# Patient Record
Sex: Male | Born: 1965 | Race: Black or African American | Hispanic: No | Marital: Single
Health system: Southern US, Community
[De-identification: ages and names within clinical notes are randomized; demographics above are authoritative.]

---

## 1999-07-09 ENCOUNTER — Ambulatory Visit (HOSPITAL_COMMUNITY): Admission: RE | Admit: 1999-07-09 | Discharge: 1999-07-09 | Payer: Self-pay | Admitting: Cardiology

## 1999-07-09 ENCOUNTER — Encounter: Payer: Self-pay | Admitting: Cardiology

## 2002-11-17 ENCOUNTER — Encounter: Admission: RE | Admit: 2002-11-17 | Discharge: 2002-12-29 | Payer: Self-pay | Admitting: Family Medicine

## 2010-06-30 ENCOUNTER — Encounter: Payer: Self-pay | Admitting: Cardiology

## 2015-05-23 ENCOUNTER — Other Ambulatory Visit: Payer: Self-pay | Admitting: Cardiology

## 2015-05-23 DIAGNOSIS — M5416 Radiculopathy, lumbar region: Secondary | ICD-10-CM

## 2015-06-01 ENCOUNTER — Other Ambulatory Visit: Payer: Self-pay | Admitting: Cardiology

## 2015-06-01 ENCOUNTER — Ambulatory Visit
Admission: RE | Admit: 2015-06-01 | Discharge: 2015-06-01 | Disposition: A | Discharge status: Home / Self Care | Payer: Managed Care, Other (non HMO) | Source: Ambulatory Visit | Attending: Cardiology | Admitting: Cardiology

## 2015-06-01 DIAGNOSIS — M5441 Lumbago with sciatica, right side: Principal | ICD-10-CM

## 2015-06-01 DIAGNOSIS — M5442 Lumbago with sciatica, left side: Principal | ICD-10-CM

## 2015-06-01 DIAGNOSIS — G8929 Other chronic pain: Secondary | ICD-10-CM

## 2015-06-01 DIAGNOSIS — M5416 Radiculopathy, lumbar region: Secondary | ICD-10-CM

## 2015-06-14 ENCOUNTER — Ambulatory Visit: Payer: Managed Care, Other (non HMO) | Attending: Cardiology

## 2015-06-14 DIAGNOSIS — M5441 Lumbago with sciatica, right side: Secondary | ICD-10-CM | POA: Diagnosis present

## 2015-06-14 DIAGNOSIS — R293 Abnormal posture: Secondary | ICD-10-CM | POA: Diagnosis present

## 2015-06-14 DIAGNOSIS — R6889 Other general symptoms and signs: Secondary | ICD-10-CM | POA: Insufficient documentation

## 2015-06-14 DIAGNOSIS — M2560 Stiffness of unspecified joint, not elsewhere classified: Secondary | ICD-10-CM | POA: Insufficient documentation

## 2015-06-14 NOTE — Patient Instructions (Signed)
From cabinet posterior pelvic tilt , LTR, knee to chest all for 2x/day, 2-3 reps  30 sec stretch and 5-10 sec hold  X 10 15 reps posterior tilt

## 2015-06-14 NOTE — Therapy (Signed)
[facility] [address] [place], [place], [address] Phone: [phone]   Fax:  [phone]  Physical Therapy Evaluation  Patient Details  Name: Travis Gillespie MRN: [id number] Date of Birth: [birth year] Referring Provider: Donia Guiles , MD  Encounter Date: 06/14/2015      PT End of Session - 06/14/15 1525    Visit Number 1   Number of Visits 12   Date for PT Re-Evaluation 07/27/15   Authorization Type [employer]   PT Start Time 1100   PT Stop Time 1145   PT Time Calculation (min) 45 min   Activity Tolerance Patient tolerated treatment well   Behavior During Therapy [facility] for tasks assessed/performed      No past medical history on file.  No past surgical history on file.  There were no vitals filed for this visit.  Visit Diagnosis:  Right-sided low back pain with right-sided sciatica - Plan: PT plan of care cert/re-cert  Joint stiffness of spine - Plan: PT plan of care cert/re-cert  Abnormal posture - Plan: PT plan of care cert/re-cert  Activity intolerance - Plan: PT plan of care cert/re-cert      Subjective Assessment - 06/14/15 1117    Subjective LBP with leg pain. Onset in past year worse past 6 months.     Pertinent History No specific injury   How long can you sit comfortably? 45 min   How long can you stand comfortably? 15 min   How long can you walk comfortably?  support does well,  100 yards without   Diagnostic tests X rays: significant degenerative changes and stenosis   Patient Stated Goals Ease/eliminate pain.    Currently in Pain? Yes   Pain Score 3    Pain Location Back  to buttocks   Pain Orientation Right   Pain Descriptors / Indicators Aching   Pain Type Chronic pain   Pain Radiating Towards To RT ankle lateral aspect but can change to RT or LT   Pain Onset More than a month ago   Pain Frequency Constant   Aggravating Factors  standing and walking    Pain Relieving Factors bent forward ,  sitting, mediation   Multiple Pain Sites No            [facility] PT Assessment - 06/14/15 0001    Assessment   Medical Diagnosis Back pain with radiculopathy   Referring Provider Donia Guiles , MD   Onset Date/Surgical Date --  Worse in past 6 months  pain over last year   Hand Dominance Right   Next MD Visit 07/22/2015   Prior Therapy No   Precautions   Precautions None   Restrictions   Weight Bearing Restrictions No   Balance Screen   Has the patient fallen in the past 6 months Yes   How many times? 1  legs went out due to pain   Has the patient had a decrease in activity level because of a fear of falling?  No   Is the patient reluctant to leave their home because of a fear of falling?  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    Cognition   Overall Cognitive Status Within Functional Limits for tasks assessed   Posture/Postural Control   Posture Comments Increased lordosis mild posterior lean hips  ER    ROM / Strength   AROM / PROM / Strength Strength;AROM   AROM   Overall AROM Comments Hip IR 15 degrees bilaterally in prone with gluteal pain   AROM Assessment Site Lumbar   Lumbar Flexion He is able to reach to 2 inches from toes   Lumbar Extension 18   Lumbar - Right Side Bend 30   Lumbar - Left Side Bend 22   Lumbar - Right Rotation decr 50%    Lumbar - Left Rotation decr 70%   Strength   Overall Strength Comments Fair abdominals, LE grossly WNL RT to LT    Palpation   Palpation comment Tender RT lower back and gluteals    Ambulation/Gait   Gait Comments WNL                           PT Education - 06/14/15 1134    Education provided Yes   Education Details POC , HEP    Person(s) Educated Patient   Methods Explanation;Demonstration;Verbal cues;Tactile cues;Handout   Comprehension Returned  demonstration          PT Short Term Goals - 06/14/15 1526    PT SHORT TERM GOAL #1   Title He will be independent with initial HEP   Time 3   Period Weeks   Status New   PT SHORT TERM GOAL #2   Title He will report pai n improved 40   PT SHORT TERM GOAL #3   Title He will report pain improved 40 % or more  with normal home activity   Time 3   Period Weeks   Status New   PT SHORT TERM GOAL #4   Title He will demo understanding of proper sitting posture   Time 3   Period Weeks   Status New   PT SHORT TERM GOAL #5   Title He will report leg pain decreased 40% or more with home tasks.    Time 3   Period Weeks   Status New           PT Long Term Goals - 06/14/15 1529    PT LONG TERM GOAL #1   Title He will be independent with all  HEP issued as of the last visit   Time 6   Period Weeks   Status New   PT LONG TERM GOAL #2   Title He will report no pain RT or LT leg for 5 days   Time 6   Period Weeks   Status New   PT LONG TERM GOAL #3   Title He will report back pain as intermittant   Time 6   Period Weeks   Status New   PT LONG TERM GOAL #4   Title He will be able to lift 25 pounds without increased pain for possible return to work   Time 6   Period Weeks   Status New               Plan - 06/14/15 1533    Clinical Impression Statement Mr Courville presents with back pain with pain into RT leg limiting his   Pt will benefit from skilled therapeutic intervention in order to improve on the following deficits Pain;Decreased range of motion;Decreased activity tolerance;Increased muscle spasms;Postural dysfunction   Rehab Potential Good   PT Frequency 2x / week   PT Duration 6 weeks   PT Treatment/Interventions Cryotherapy;Electrical Stimulation;Iontophoresis 4mg /ml Dexamethasone;Moist  Heat;Ultrasound;Therapeutic exercise;Patient/family education;Manual techniques;Passive range of motion;Dry needling;Taping   PT Next Visit Plan Progress HEP and modalities  as needed.Manual    PT Home Exercise Plan stretching   Consulted and Agree with Plan of Care Patient         Problem List There are no active problems to display for this patient.   Caprice RedChasse, Taiki Buckwalter M PT 06/14/2015, 3:45 PM  [facility] [address] [place], [place], [address] Phone: [phone]   Fax:  [phone]  Name: Travis Gillespie MRN: [id number] Date of Birth: October 01, 1965

## 2015-06-19 ENCOUNTER — Ambulatory Visit: Payer: Managed Care, Other (non HMO) | Admitting: Physical Therapy

## 2015-06-22 ENCOUNTER — Ambulatory Visit: Payer: Managed Care, Other (non HMO)

## 2015-06-28 ENCOUNTER — Ambulatory Visit: Payer: Managed Care, Other (non HMO) | Admitting: Physical Therapy

## 2015-07-03 ENCOUNTER — Encounter: Payer: Managed Care, Other (non HMO) | Admitting: Physical Therapy

## 2015-07-05 ENCOUNTER — Ambulatory Visit: Payer: Managed Care, Other (non HMO)

## 2015-07-05 DIAGNOSIS — R6889 Other general symptoms and signs: Secondary | ICD-10-CM

## 2015-07-05 DIAGNOSIS — M2560 Stiffness of unspecified joint, not elsewhere classified: Secondary | ICD-10-CM

## 2015-07-05 DIAGNOSIS — M5441 Lumbago with sciatica, right side: Secondary | ICD-10-CM

## 2015-07-05 DIAGNOSIS — R293 Abnormal posture: Secondary | ICD-10-CM

## 2015-07-05 NOTE — Therapy (Signed)
[facility] [address] [place], [place], [address] Phone: [phone]   Fax:  [phone]  Physical Therapy Treatment  Patient Details  Name: Travis Gillespie MRN: [id number] Date of Birth: 1966/03/19 Referring Provider: Donia Guiles , MD  Encounter Date: 07/05/2015      PT End of Session - 07/05/15 1117    Visit Number 2   Number of Visits 12   Date for PT Re-Evaluation 08/24/15   PT Start Time 1100   PT Stop Time 1150   PT Time Calculation (min) 50 min   Activity Tolerance Patient tolerated treatment well   Behavior During Therapy [facility] for tasks assessed/performed      No past medical history on file.  No past surgical history on file.  There were no vitals filed for this visit.  Visit Diagnosis:  Right-sided low back pain with right-sided sciatica - Plan: PT plan of care cert/re-cert  Joint stiffness of spine - Plan: PT plan of care cert/re-cert  Abnormal posture - Plan: PT plan of care cert/re-cert  Activity intolerance - Plan: PT plan of care cert/re-cert      Subjective Assessment - 07/05/15 1107    Subjective Same situation with soreness in back. He reports attendance issue was due to finance. Did exercoises 2x/day.    How long can you sit comfortably? 1-2 hours   How long can you stand comfortably? 20 min   Currently in Pain? No/denies   Pain Score --  Cam be as high as 5/10   Multiple Pain Sites No                         [facility] Adult PT Treatment/Exercise - 07/05/15 1123    Lumbar Exercises: Supine   Bent Knee Raise 10 reps   Bent Knee Raise Limitations RT and LT  3 sec    Other Supine Lumbar Exercises head and shouilder raise with reach toward ankles in 100's prep.   Lumbar Exercises: Sidelying   Clam 10 reps     Much of time was spent in initial re assesment since not here for 3 weeks and with demo and cues to do exercises correctly             PT Short Term Goals -  07/05/15 1152    PT SHORT TERM GOAL #1   Title He will be independent with initial HEP   Status Achieved   PT SHORT TERM GOAL #2   Title He will report pai n improved 40   Status On-going   PT SHORT TERM GOAL #3   Title He will report pain improved 40 % or more  with normal home activity   Status On-going   PT SHORT TERM GOAL #4   Title He will demo understanding of proper sitting posture   Status On-going   PT SHORT TERM GOAL #5   Title He will report leg pain decreased 40% or more with home tasks.    Status On-going           PT Long Term Goals - 06/14/15 1529    PT LONG TERM GOAL #1   Title He will be independent with all  HEP issued as of the last visit   Time 6   Period Weeks   Status New   PT LONG TERM GOAL #2   Title He will report no pain RT or LT leg for 5 days   Time  6   Period Weeks   Status New   PT LONG TERM GOAL #3   Title He will report back pain as intermittant   Time 6   Period Weeks   Status New   PT LONG TERM GOAL #4   Title He will be able to lift 25 pounds without increased pain for possible return to work   Time 6   Period Weeks   Status New               Plan - 07/05/15 1117    Clinical Impression Statement [name] reports no improvement but the time he tolerates sitting and standing were better than at eval. He is able to demo HEP. Continue stab exercises   PT Next Visit Plan Stab exercises and modalities as needed   Consulted and Agree with Plan of Care Patient        Problem List There are no active problems to display for this patient.   Caprice Red PT 07/05/2015, 11:57 AM  [facility] [address] [place], [place], [address] Phone: [phone]   Fax:  [phone]  Name: Travis Gillespie MRN: [id number] Date of Birth: 17-Nov-1965

## 2015-07-05 NOTE — Patient Instructions (Signed)
Issued from cabinet Pilates scissors, shoulder bridge and clam 10-15 reps, 1-2x/day hold 3-5 sec hold

## 2015-07-10 ENCOUNTER — Ambulatory Visit: Payer: Managed Care, Other (non HMO) | Admitting: Physical Therapy

## 2015-07-12 ENCOUNTER — Ambulatory Visit: Payer: Managed Care, Other (non HMO) | Attending: Cardiology | Admitting: Physical Therapy

## 2015-07-12 DIAGNOSIS — R293 Abnormal posture: Secondary | ICD-10-CM | POA: Insufficient documentation

## 2015-07-12 DIAGNOSIS — R6889 Other general symptoms and signs: Secondary | ICD-10-CM | POA: Diagnosis present

## 2015-07-12 DIAGNOSIS — M5441 Lumbago with sciatica, right side: Secondary | ICD-10-CM | POA: Diagnosis not present

## 2015-07-12 DIAGNOSIS — M2560 Stiffness of unspecified joint, not elsewhere classified: Secondary | ICD-10-CM

## 2015-07-12 NOTE — Therapy (Addendum)
[place], [place], [address] Phone: [phone]   Fax:  [phone]  Physical Therapy Treatment  Patient Details  Name: Travis Gillespie MRN: [id number] Date of Birth: 1966-01-11 Referring Provider: Wallene Huh , MD  Encounter Date: 07/12/2015      PT End of Session - 07/12/15 1321    Visit Number 3   Number of Visits 12   Date for PT Re-Evaluation 08/24/15   PT Start Time 1103   PT Stop Time 1146   PT Time Calculation (min) 43 min   Activity Tolerance Patient tolerated treatment well   Behavior During Therapy [facility] for tasks assessed/performed      No past medical history on file.  No past surgical history on file.  There were no vitals filed for this visit.  Visit Diagnosis:  Right-sided low back pain with right-sided sciatica  Joint stiffness of spine  Abnormal posture  Activity intolerance      Subjective Assessment - 07/12/15 1109    Subjective 1-2/10.  No leg pain.  Has pain with walking unsupported    Currently in Pain? Yes   Pain Score 1   to 2/10   Pain Location Back   Pain Orientation Right   Pain Descriptors / Indicators Aching   Pain Radiating Towards Leg  RT/LT intermittantly   Aggravating Factors  walking without support   Pain Relieving Factors Bending forward. Stretching                         [facility] Adult PT Treatment/Exercise - 07/12/15 0001    Self-Care   Self-Care --  ADL handout issued, briefly discussed, demonstrated some   Lumbar Exercises: Stretches   Passive Hamstring Stretch 3 reps;30 seconds   Lower Trunk Rotation 5 reps;10 seconds  both,  pain end range legs each side   Lumbar Exercises: Standing   Heel Raises 10 reps  both,  RT cues to brace abdomin to avoid pain.   Side Lunge --  4 X side stepping 2 steps each RT and LT.  with mini squat.    Wall Slides 10 reps;1 second  added to home exercises.   Other Standing Lumbar Exercises  hip hinge 10 X cues   Lumbar Exercises: Supine   Ab Set 5 reps  breathing   Bent Knee Raise 10 reps   Bent Knee Raise Limitations cues breathing   Bridge 10 reps  cues   Other Supine Lumbar Exercises modified curl ups 15 reps, breathing, technique cues,    Lumbar Exercises: Sidelying   Clam 10 reps  each   Lumbar Exercises: Quadruped   Madcat/Old Horse --  2 reps to find neutral spine   Madcat/Old Horse Limitations old horse painful.  Felt his back pop,.   Single Arm Raise 5 reps   Knee/Hip Exercises: Stretches   Gastroc Stretch 3 reps;30 seconds                PT Education - 07/12/15 1321    Education provided Yes   Education Details ADL, stretches and wall slides.   Person(s) Educated Patient   Methods Explanation;Demonstration;Tactile cues;Verbal cues;Handout   Comprehension Verbalized understanding;Returned demonstration          PT Short Term Goals - 07/12/15 1323    PT SHORT TERM GOAL #1   Title He will be independent with initial HEP   Time 3   Period Weeks   Status  Achieved   PT SHORT TERM GOAL #2   Title He will report pai n improved 40   Status On-going   PT SHORT TERM GOAL #3   Title He will report pain improved 40 % or more  with normal home activity   Time 3   Period Weeks   Status On-going   PT SHORT TERM GOAL #4   Title He will demo understanding of proper sitting posture   Time 3   Period Weeks   Status On-going   PT SHORT TERM GOAL #5   Title He will report leg pain decreased 40% or more with home tasks.    Time 3   Period Weeks   Status On-going           PT Long Term Goals - 06/14/15 1529    PT LONG TERM GOAL #1   Title He will be independent with all  HEP issued as of the last visit   Time 6   Period Weeks   Status New   PT LONG TERM GOAL #2   Title He will report no pain RT or LT leg for 5 days   Time 6   Period Weeks   Status New   PT LONG TERM GOAL #3   Title He will report back pain as intermittant   Time 6    Period Weeks   Status New   PT LONG TERM GOAL #4   Title He will be able to lift 25 pounds without increased pain for possible return to work   Time 6   Period Weeks   Status New               Plan - 07/12/15 1322    Clinical Impression Statement Pain about the same as when he arrived.  Some exercises today he was pain free.  Use of abdominals helpful.  Progress toward home exercise goal.  No new goals met.    PT Next Visit Plan stretch, stabilization.     PT Home Exercise Plan hamstrings, calf stretches, wall sits.   Consulted and Agree with Plan of Care Patient        Problem List There are no active problems to display for this patient.   HARRIS,KAREN 07/12/2015, 1:25 PM  [facility] [address] [place], [place], [address] Phone: [phone]   Fax:  [phone]  Name: Travis Gillespie MRN: [id number] Date of Birth: 1966-01-08    Melvenia Needles, PTA 07/12/2015 1:25 PM Phone: [phone] Fax: [phone] PHYSICAL THERAPY DISCHARGE SUMMARY  Visits from Start of Care: 3  Current functional level related to goals / functional outcomes: Unknown as he has not returned since 07/12/15 and has only attended 2 sessions since evaluation on 06/14/15   Remaining deficits: Unknown   Education / Equipment: HEP Plan:                                                    Patient goals were not met. Patient is being discharged due to not returning since the last visit.  ?????   Lillette Boxer Chasse  PT                   08/06/15  11:36  AM

## 2015-07-12 NOTE — Patient Instructions (Addendum)
Wall Sit    Back against wall, slide down so knees are at 90 angle. Hold _0to 10___ seconds. Do __1__ sets. Complete _10___ repetitions. gastroc and hamstring stretch added to home exercises from exercise drawer.  Daily 3 X 30 each.  http://st.exer.us/294   Copyright  VHI. All rights reserved.  Sleeping on Back  Place pillow under knees. A pillow with cervical support and a roll around waist are also helpful. Copyright  VHI. All rights reserved.  Sleeping on Side Place pillow between knees. Use cervical support under neck and a roll around waist as needed. Copyright  VHI. All rights reserved.   Sleeping on Stomach   If this is the only desirable sleeping position, place pillow under lower legs, and under stomach or chest as needed.  Posture - Sitting   Sit upright, head facing forward. Try using a roll to support lower back. Keep shoulders relaxed, and avoid rounded back. Keep hips level with knees. Avoid crossing legs for long periods. Stand to Sit / Sit to Stand   To sit: Bend knees to lower self onto front edge of chair, then scoot back on seat. To stand: Reverse sequence by placing one foot forward, and scoot to front of seat. Use rocking motion to stand up.   Work Height and Reach  Ideal work height is no more than 2 to 4 inches below elbow level when standing, and at elbow level when sitting. Reaching should be limited to arm's length, with elbows slightly bent.  Bending  Bend at hips and knees, not back. Keep feet shoulder-width apart.    Posture - Standing   Good posture is important. Avoid slouching and forward head thrust. Maintain curve in low back and align ears over shoul- ders, hips over ankles.  Alternating Positions   Alternate tasks and change positions frequently to reduce fatigue and muscle tension. Take rest breaks. Computer Work   Position work to Art gallery manager. Use proper work and seat height. Keep shoulders back and down, wrists straight,  and elbows at right angles. Use chair that provides full back support. Add footrest and lumbar roll as needed.  Getting Into / Out of Car  Lower self onto seat, scoot back, then bring in one leg at a time. Reverse sequence to get out.  Dressing  Lie on back to pull socks or slacks over feet, or sit and bend leg while keeping back straight.    Housework - Sink  Place one foot on ledge of cabinet under sink when standing at sink for prolonged periods.   Pushing / Pulling  Pushing is preferable to pulling. Keep back in proper alignment, and use leg muscles to do the work.  Deep Squat   Squat and lift with both arms held against upper trunk. Tighten stomach muscles without holding breath. Use smooth movements to avoid jerking.  Avoid Twisting   Avoid twisting or bending back. Pivot around using foot movements, and bend at knees if needed when reaching for articles.  Carrying Luggage   Distribute weight evenly on both sides. Use a cart whenever possible. Do not twist trunk. Move body as a unit.   Lifting Principles .Maintain proper posture and head alignment. .Slide object as close as possible before lifting. .Move obstacles out of the way. .Test before lifting; ask for help if too heavy. .Tighten stomach muscles without holding breath. .Use smooth movements; do not jerk. .Use legs to do the work, and pivot with feet. .Distribute the work load symmetrically and  close to the center of trunk. .Push instead of pull whenever possible.   Ask For Help   Ask for help and delegate to others when possible. Coordinate your movements when lifting together, and maintain the low back curve.  Log Roll   Lying on back, bend left knee and place left arm across chest. Roll all in one movement to the right. Reverse to roll to the left. Always move as one unit. Housework - Sweeping  Use long-handled equipment to avoid stooping.   Housework - Wiping  Position yourself as close  as possible to reach work surface. Avoid straining your back.  Laundry - Unloading Wash   To unload small items at bottom of washer, lift leg opposite to arm being used to reach.  Gardening - Raking  Move close to area to be raked. Use arm movements to do the work. Keep back straight and avoid twisting.     Cart  When reaching into cart with one arm, lift opposite leg to keep back straight.   Getting Into / Out of Bed  Lower self to lie down on one side by raising legs and lowering head at the same time. Use arms to assist moving without twisting. Bend both knees to roll onto back if desired. To sit up, start from lying on side, and use same move-ments in reverse. Housework - Vacuuming  Hold the vacuum with arm held at side. Step back and forth to move it, keeping head up. Avoid twisting.   Laundry - Armed forces training and education officer so that bending and twisting can be avoided.   Laundry - Unloading Dryer  Squat down to reach into clothes dryer or use a reacher.  Gardening - Weeding / Psychiatric nurse or Kneel. Knee pads may be helpful.

## 2016-12-05 IMAGING — MR MR LUMBAR SPINE W/O CM
4 of 5 series · 21 of 48 positions shown · non-contrast
Comparison: None.

CLINICAL DATA: Low back pain extending to both hips and lateral
legs over 6 months.

EXAM:
MRI LUMBAR SPINE WITHOUT CONTRAST
TECHNIQUE: Multiplanar, multisequence MR imaging of the lumbar spine was
performed. No intravenous contrast was administered.

[Series 7: T1 · sagittal · 4.0mm · 0.76mm/px · 3 of 15 slices shown (1 of 2)]
[im 3/15]
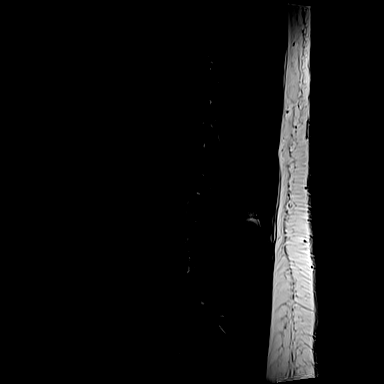
[im 9/15]
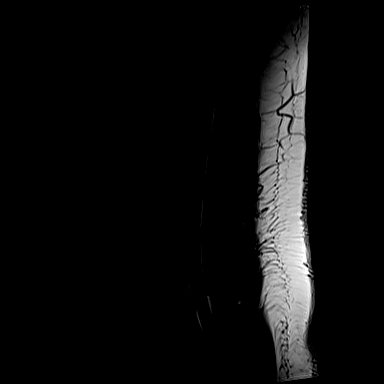
[im 15/15]
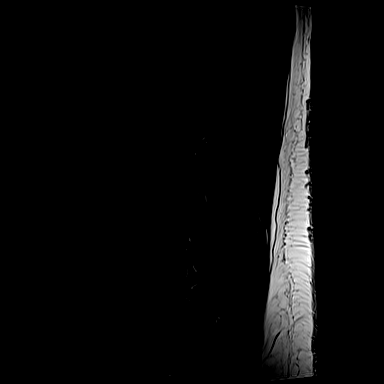

[Series 8: T2 · sagittal · 4.0mm · 0.76mm/px · 6 of 15 slices shown (1 of 2)]
[im 1/15]
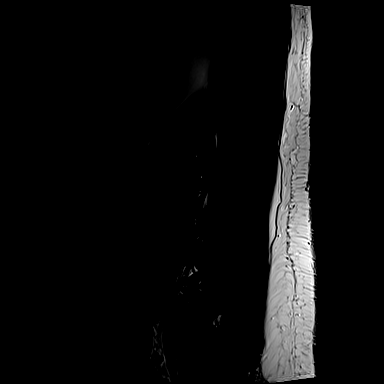
[im 3/15]
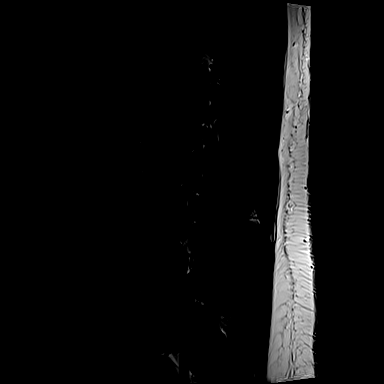
[im 6/15]
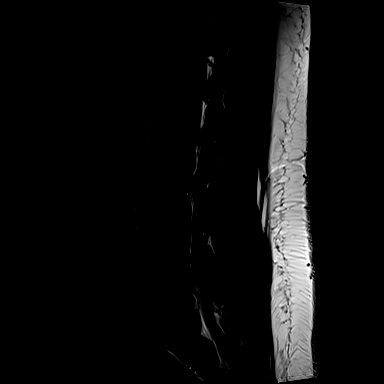
[im 9/15]
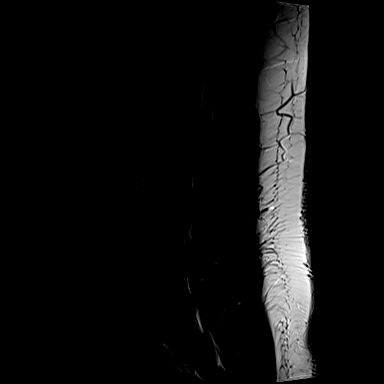
[im 12/15]
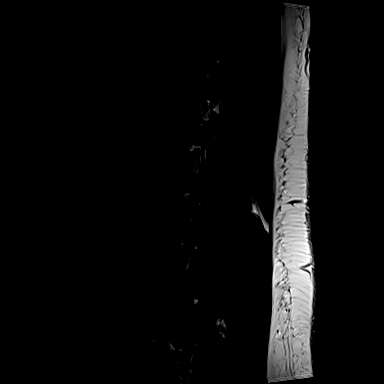
[im 15/15]
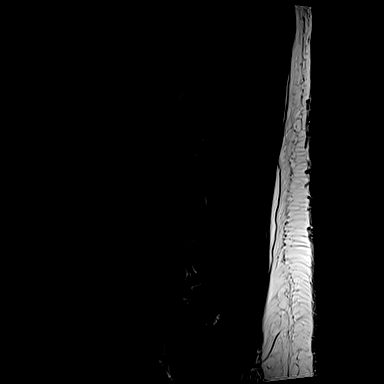

[Series 10: T1 · axial · 4.0mm · 0.55mm/px · z∈[-71,+75]mm · 3 of 36 slices shown (2 of 2)]
[im 6/36]
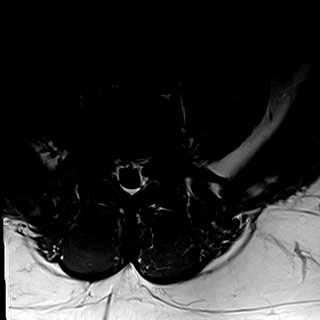
[im 18/36]
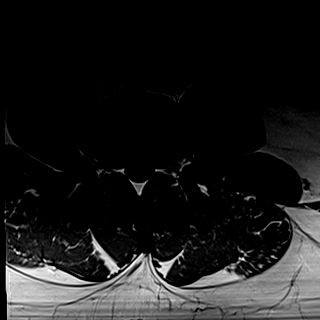
[im 31/36]
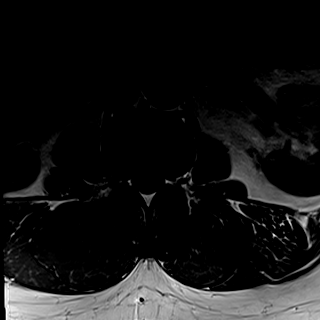

[Series 11: T2 · axial · 4.0mm · 0.31mm/px · z∈[-88,+108]mm · 9 of 36 slices shown (2 of 2)]
[im 1/36]
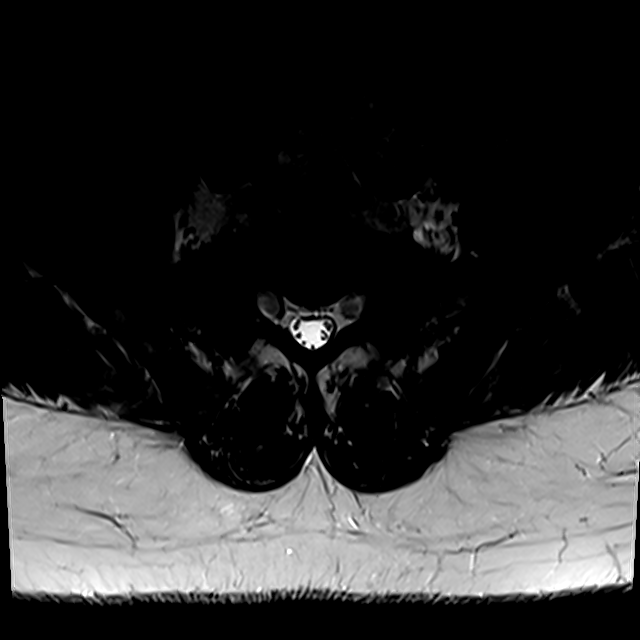
[im 6/36]
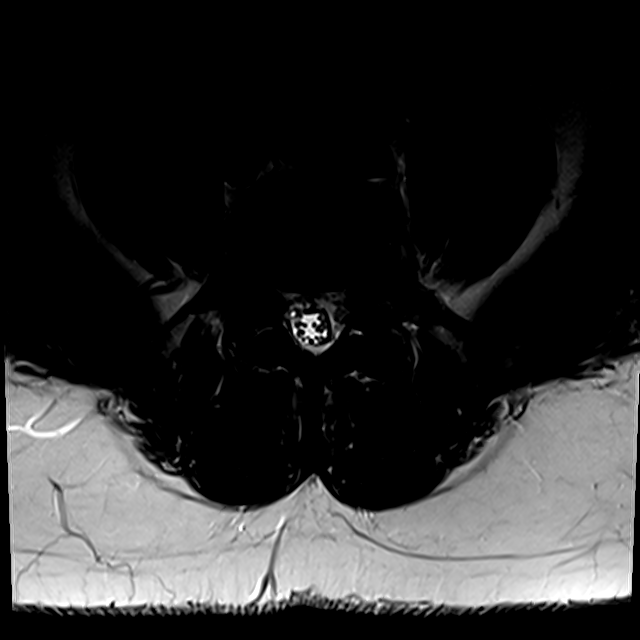
[im 11/36]
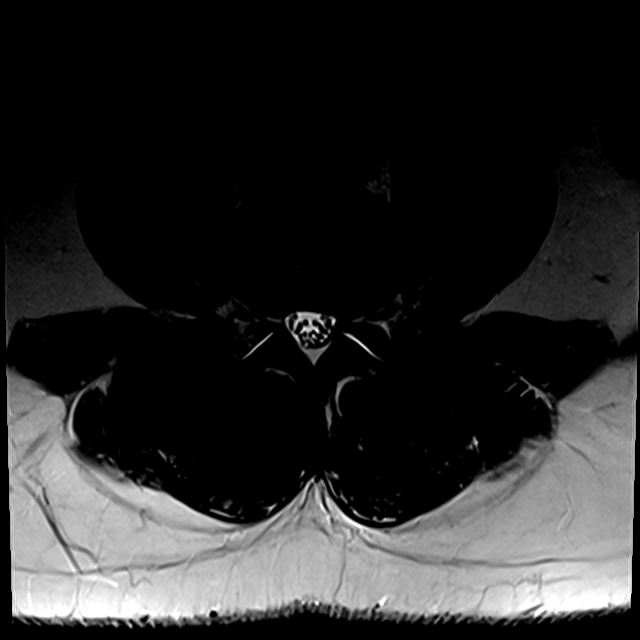
[im 16/36]
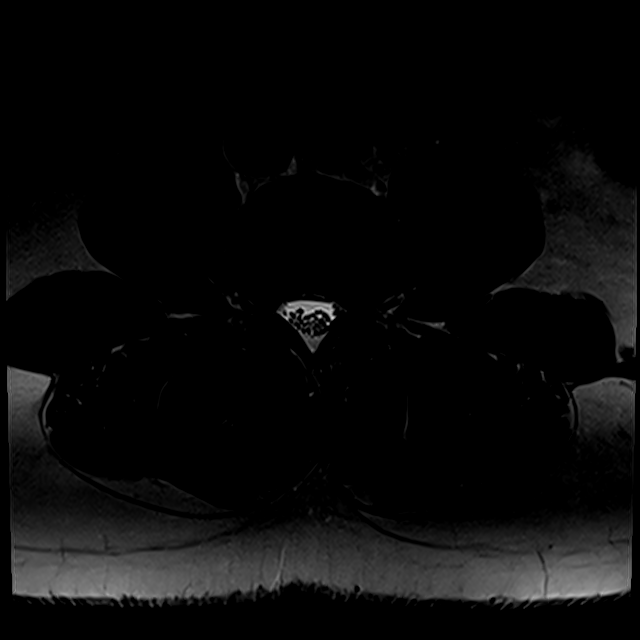
[im 18/36]
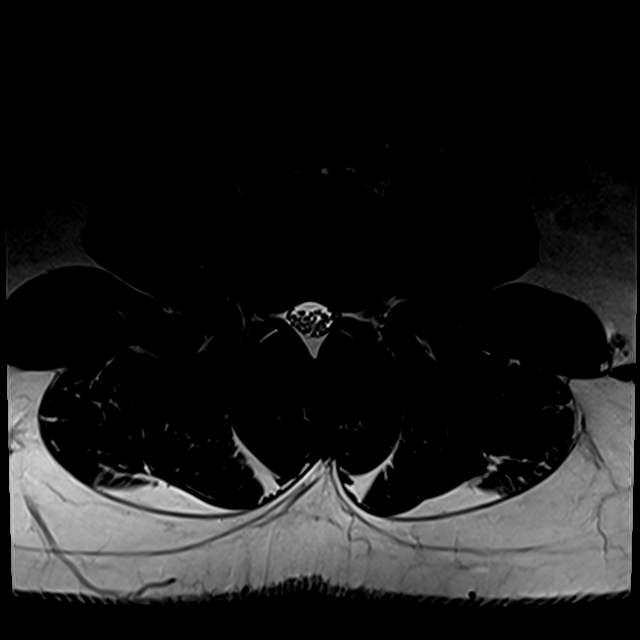
[im 21/36]
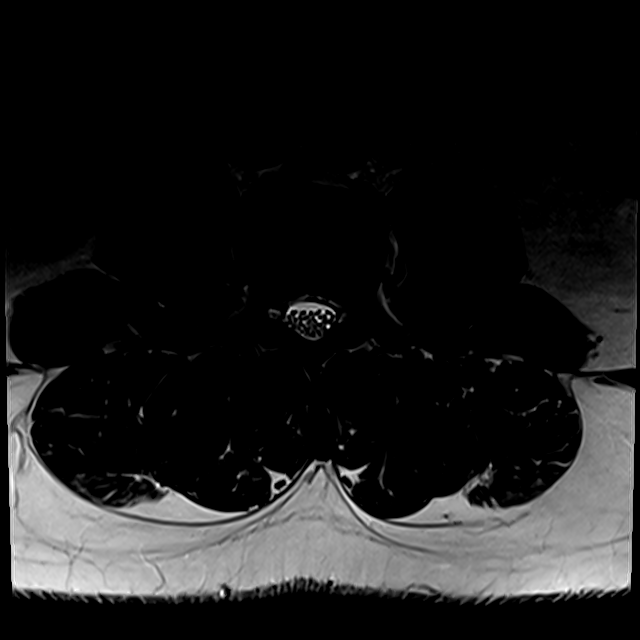
[im 26/36]
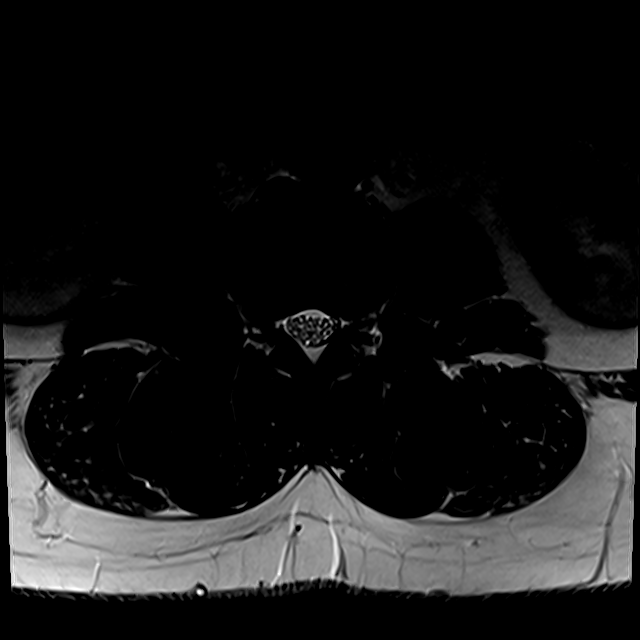
[im 31/36]
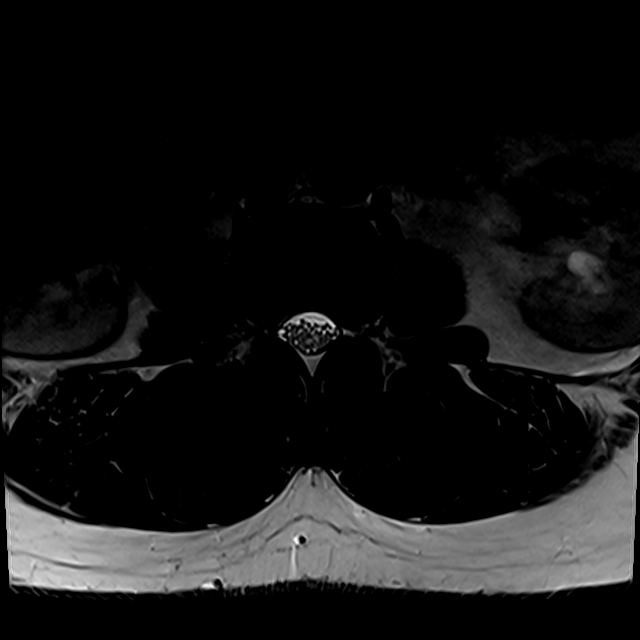
[im 36/36]
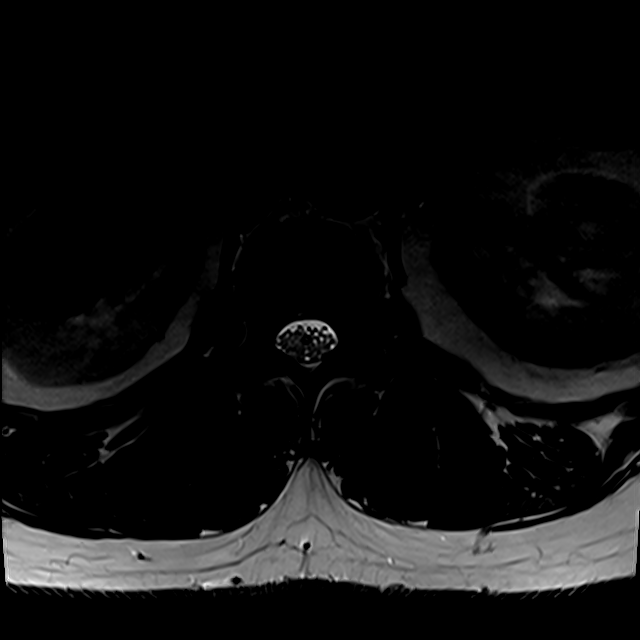

[21 of 48 positions shown; findings below may reference images not displayed]

FINDINGS: Normal signal is present in the conus medullaris which terminates at
L1. Marrow signal is within normal limits. Grade 1 anterolisthesis
is present at L5-S1 with bilateral L5 pars defects.

Limited imaging of the abdomen is unremarkable. There is no
significant adenopathy.

The disc levels at L3-4 above are normal.

L4-5: Slight disc bulging is present. There is mild facet
hypertrophy. Short pedicles are present. No focal stenosis is
evident.

L5-S1: There is uncovering of a broad-based disc protrusion. A
central annular tear is present. The central canal is patent. Severe
foraminal stenosis is worse right than left.
IMPRESSION: 1. Severe foraminal narrowing at L5-S1 is worse on the right.
2. Bilateral L5 pars defects with grade 1 anterolisthesis and
uncovering of a broad-based disc protrusion at L5-S1. No significant
central canal stenosis is present.
3. Mild disc bulging and facet hypertrophy at L4-5 as well is short
pedicles without discrete focal stenosis.

## 2016-12-05 IMAGING — CR DG HIP (WITH OR WITHOUT PELVIS) 5+V BILAT
5 series · 5 of 5 positions shown · non-contrast
Comparison: Non

CLINICAL DATA: Intermittent back and RIGHT hip pain which travels
down RIGHT leg for 1 year, over past month going down both legs and
into both hips

EXAM:
DG HIP (WITH OR WITHOUT PELVIS) 5+V BILAT

[w pelvis upright]
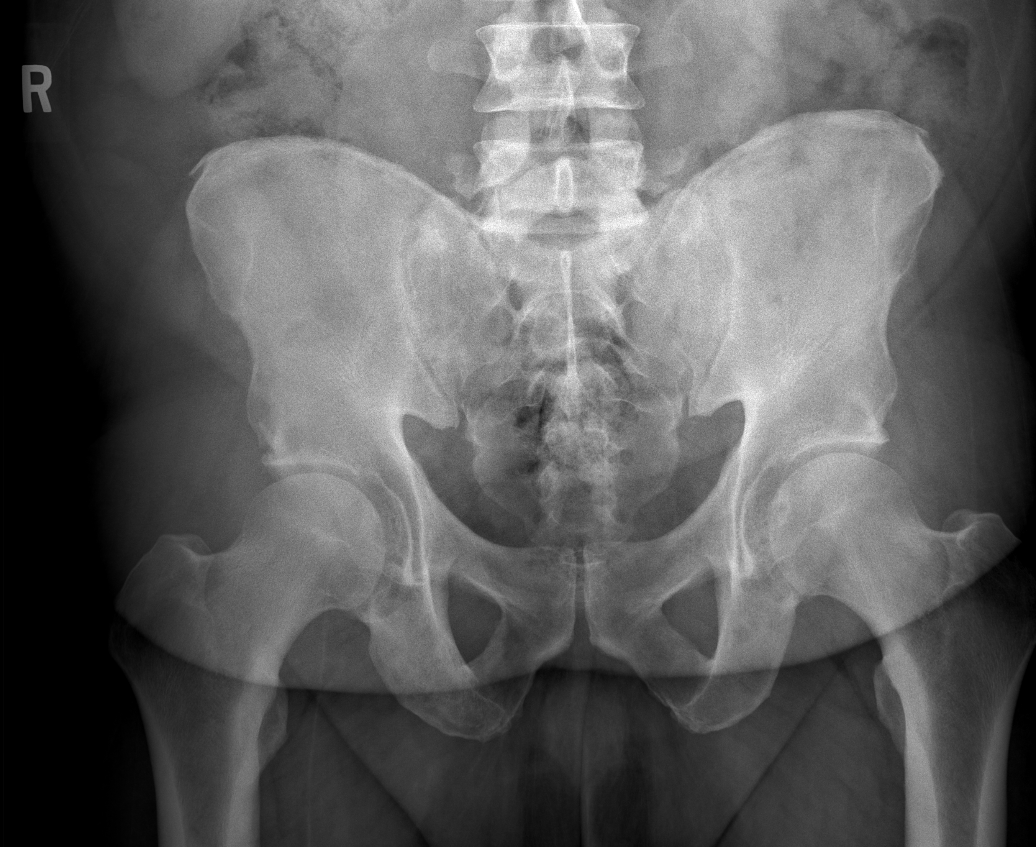

[t hip ap right]
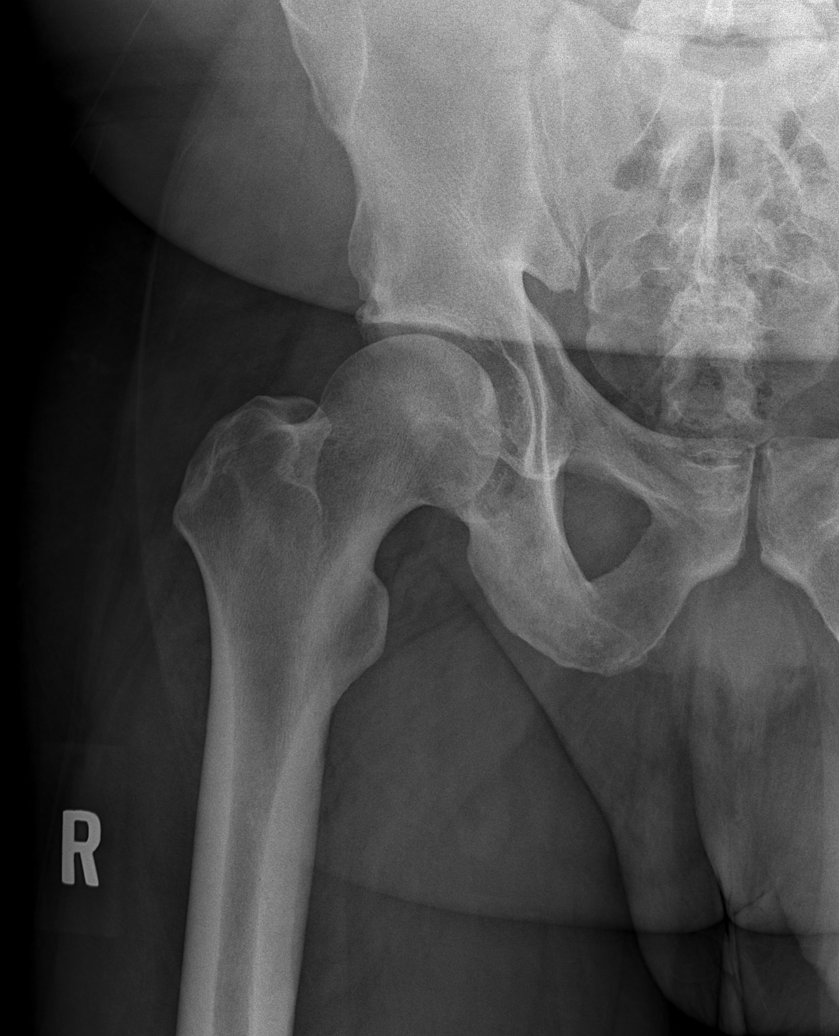

[t hip frog right]
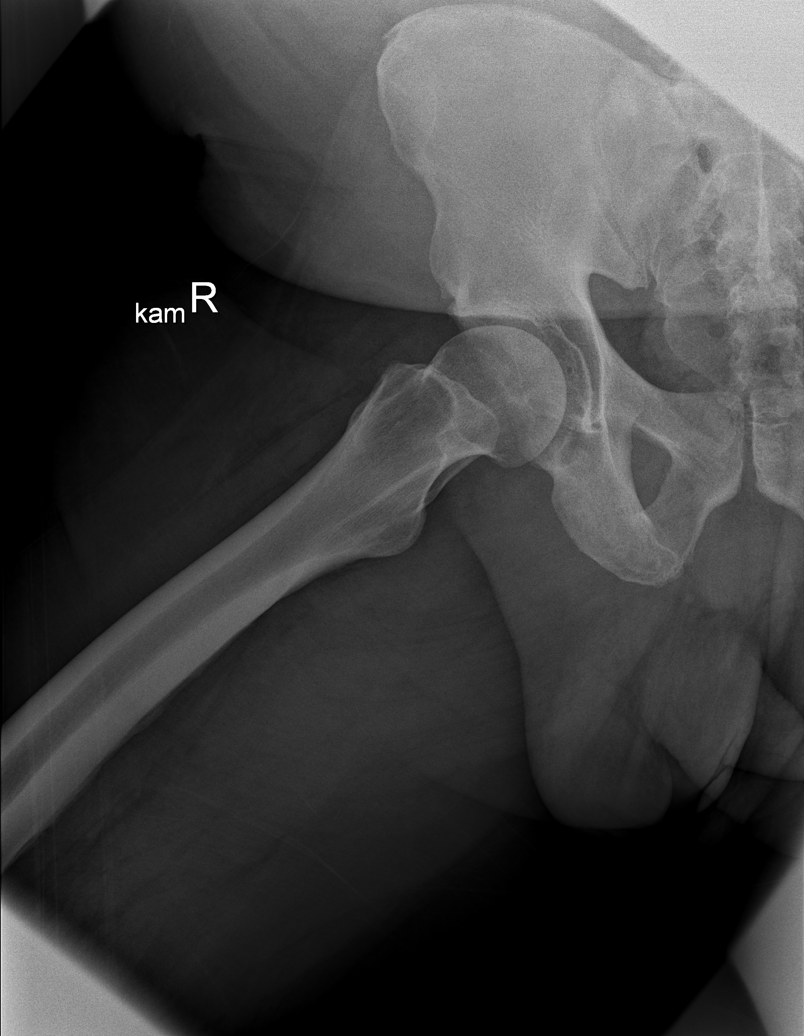

[t hip ap left]
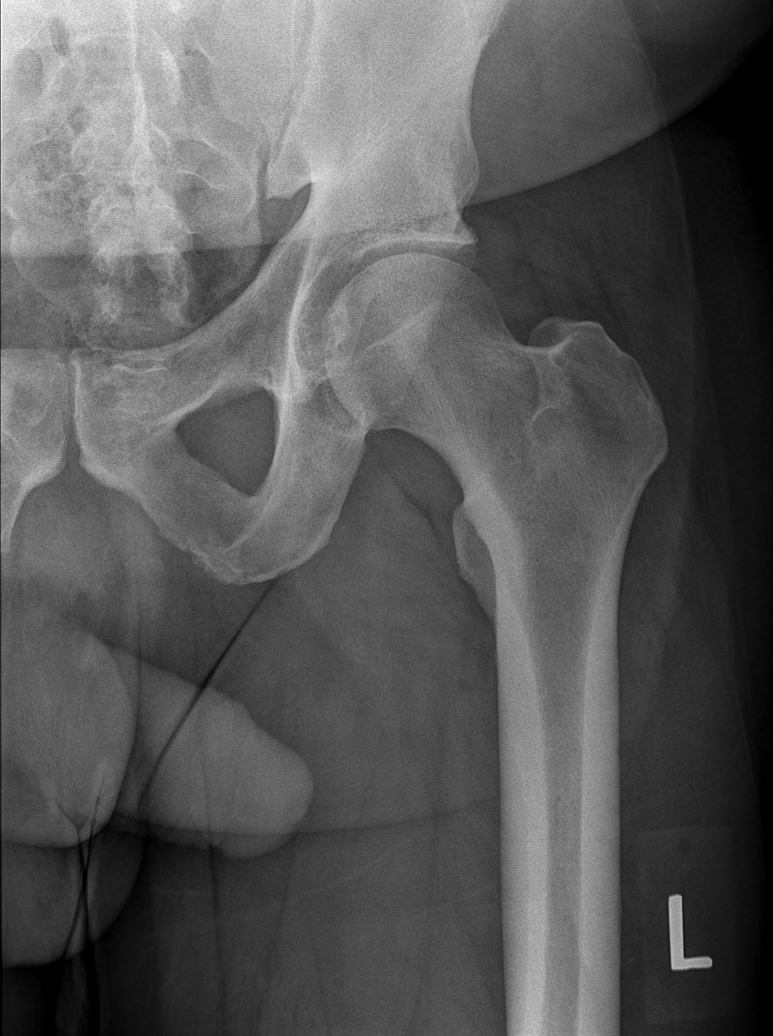

[t hip frog left]
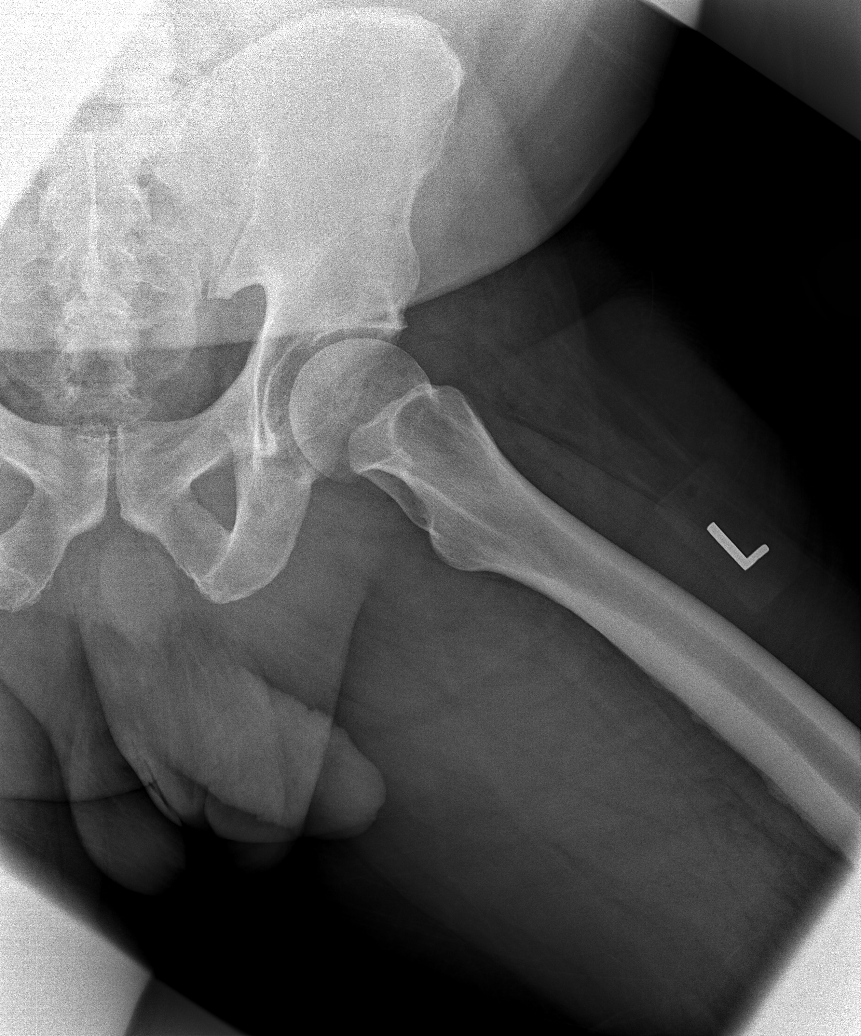

[5 of 5 positions shown; findings below may reference images not displayed]

FINDINGS: Osseous mineralization normal.

Hip and SI joint spaces symmetric and preserved.

No acute fracture, dislocation, or bone destruction.
IMPRESSION: No acute osseous abnormalities.

## 2021-06-21 ENCOUNTER — Other Ambulatory Visit: Payer: Self-pay | Admitting: Internal Medicine

## 2021-06-21 DIAGNOSIS — R42 Dizziness and giddiness: Secondary | ICD-10-CM

## 2021-06-25 ENCOUNTER — Ambulatory Visit
Admission: RE | Admit: 2021-06-25 | Discharge: 2021-06-25 | Disposition: A | Discharge status: Home / Self Care | Payer: Managed Care, Other (non HMO) | Source: Ambulatory Visit | Attending: Internal Medicine | Admitting: Internal Medicine

## 2021-06-25 DIAGNOSIS — R42 Dizziness and giddiness: Secondary | ICD-10-CM
